# Patient Record
Sex: Female | Born: 2005 | Race: Black or African American | Hispanic: No | Marital: Single | Smoking: Never smoker
Health system: Southern US, Community
[De-identification: ages and names within clinical notes are randomized; demographics above are authoritative.]

## PROBLEM LIST (undated history)

## (undated) DIAGNOSIS — F329 Major depressive disorder, single episode, unspecified: Secondary | ICD-10-CM

## (undated) DIAGNOSIS — F32A Depression, unspecified: Secondary | ICD-10-CM

## (undated) DIAGNOSIS — R51 Headache: Secondary | ICD-10-CM

## (undated) DIAGNOSIS — R519 Headache, unspecified: Secondary | ICD-10-CM

---
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
## 2023-01-21 ENCOUNTER — Telehealth: Payer: Self-pay

## 2023-01-21 NOTE — Telephone Encounter (Signed)
 Called patient no answer and vm not set up. Patient wanted to schedule appointment with Bernell List.

## 2023-02-04 ENCOUNTER — Encounter: Payer: Self-pay | Admitting: Family

## 2023-04-22 ENCOUNTER — Telehealth: Payer: Self-pay

## 2023-04-22 NOTE — Telephone Encounter (Signed)
 Called patient and left message to return call regarding appt with Bernell List

## 2023-11-14 ENCOUNTER — Ambulatory Visit
Admission: EM | Admit: 2023-11-14 | Discharge: 2023-11-14 | Disposition: A | Discharge status: Home / Self Care | Source: Ambulatory Visit | Attending: Family Medicine | Admitting: Family Medicine

## 2023-11-14 DIAGNOSIS — J101 Influenza due to other identified influenza virus with other respiratory manifestations: Secondary | ICD-10-CM

## 2023-11-14 LAB — POC COVID19/FLU A&B COMBO
Covid Antigen, POC: NEGATIVE
Influenza A Antigen, POC: POSITIVE — AB
Influenza B Antigen, POC: NEGATIVE

## 2023-11-14 LAB — POCT RAPID STREP A (OFFICE): Rapid Strep A Screen: NEGATIVE

## 2023-11-14 MED ORDER — ACETAMINOPHEN 325 MG PO TABS
650.0000 mg | ORAL_TABLET | Freq: Once | ORAL | Status: AC
  Administered 2023-11-14: 650 mg via ORAL

## 2023-11-14 MED ORDER — BENZONATATE 200 MG PO CAPS: 200.0000 mg | ORAL_CAPSULE | Freq: Three times a day (TID) | ORAL | 0 refills | Status: AC | PRN

## 2023-11-14 NOTE — Discharge Instructions (Signed)
 You have tested positive for flu A.  This is a viral illness.  You may continue ibuprofen  or Tylenol  over-the-counter as needed for fever management.  You are given Tylenol  while in the clinic.  Start Tessalon  3 times a day as needed for your cough.  Lots of rest and fluids.  Please follow-up with your PCP if your symptoms do not improve.  Please go to the ER if you develop any worsening symptoms.  I hope you feel better soon!

## 2023-11-14 NOTE — ED Triage Notes (Signed)
 Pt present with c/o cough, headache and sore throat x 2 days. Pt states she has felt warm, does ot know if she had a fever.  Home interventions: OTC cough medicine

## 2023-11-14 NOTE — ED Provider Notes (Signed)
 [facility]    CSN: [id number] Arrival date & time: 11/14/23  1029      History   Chief Complaint Chief Complaint  Patient presents with   Cough   Headache   Sore Throat    HPI Teresa Wilkins is a 18 y.o. female  presents for evaluation of URI symptoms for 2 days. Patient reports associated symptoms of cough, congestion, sore throat, headache, body aches, fever. Denies N/V/D, pain, shortness of breath. Patient does not have a hx of asthma. Patient is not an active smoker.   Reports no known sick contacts.  Pt has taken cough medicine OTC for symptoms. Pt has no other concerns at this time.    Cough Associated symptoms: fever, headaches, myalgias and sore throat   Headache Associated symptoms: congestion, cough, fever, myalgias and sore throat   Sore Throat Associated symptoms include headaches.    Past Medical History:  Diagnosis Date   Depression    Headache     Patient Active Problem List   Diagnosis Date Noted   Severe recurrent major depression without psychotic features ([facility]) 11/15/2016   MDD (major depressive disorder), recurrent episode, severe ([facility]) 11/07/2016   Suicide ideation 11/07/2016    History reviewed. No pertinent surgical history.  OB History   No obstetric history on file.      Home Medications    Prior to Admission medications   Medication Sig Start Date End Date Taking? Authorizing Provider  benzonatate  (TESSALON ) 200 MG capsule Take 1 capsule (200 mg total) by mouth 3 (three) times daily as needed. 11/14/23  Yes Samanth Mirkin, Jodi R, NP  escitalopram  (LEXAPRO ) 10 MG tablet Take 1 tablet (10 mg total) daily by mouth. Patient not taking: Reported on 05/02/2018 11/21/16   Debby Fortes, NP    Family History History reviewed. No pertinent family history.  Social History Social History   Tobacco Use   Smoking status: Never    Passive exposure: Yes   Smokeless tobacco: Never  Vaping Use   Vaping status: Never Used   Substance Use Topics   Alcohol use: No   Drug use: No     Allergies   Patient has no known allergies.   Review of Systems Review of Systems  Constitutional:  Positive for fever.  HENT:  Positive for congestion and sore throat.   Respiratory:  Positive for cough.   Musculoskeletal:  Positive for myalgias.  Neurological:  Positive for headaches.     Physical Exam Triage Vital Signs ED Triage Vitals  Encounter Vitals Group     BP 11/14/23 1056 113/77     Girls Systolic BP Percentile --      Girls Diastolic BP Percentile --      Boys Systolic BP Percentile --      Boys Diastolic BP Percentile --      Pulse Rate 11/14/23 1056 (!) 107     Resp 11/14/23 1056 17     Temp 11/14/23 1056 (!) 101.2 F (38.4 C)     Temp Source 11/14/23 1056 Oral     SpO2 11/14/23 1056 95 %     Weight --      Height --      Head Circumference --      Peak Flow --      Pain Score 11/14/23 1055 6     Pain Loc --      Pain Education --      Exclude from Growth Chart --    No  data found.  Updated Vital Signs BP 113/77 (BP Location: Right Arm)   Pulse (!) 107   Temp (!) 101.2 F (38.4 C) (Oral)   Resp 17   LMP  (LMP Unknown) Comment: Pt states she does not remember  SpO2 95%   Visual Acuity Right Eye Distance:   Left Eye Distance:   Bilateral Distance:    Right Eye Near:   Left Eye Near:    Bilateral Near:     Physical Exam Vitals and nursing note reviewed.  Constitutional:      General: She is not in acute distress.    Appearance: She is well-developed. She is not ill-appearing.  HENT:     Head: Normocephalic and atraumatic.     Right Ear: Tympanic membrane and ear canal normal.     Left Ear: Tympanic membrane and ear canal normal.     Nose: Congestion present.     Mouth/Throat:     Mouth: Mucous membranes are moist.     Pharynx: Oropharynx is clear. Uvula midline. Posterior oropharyngeal erythema present.     Tonsils: No tonsillar exudate or tonsillar abscesses.  Eyes:      Conjunctiva/sclera: Conjunctivae normal.     Pupils: Pupils are equal, round, and reactive to light.  Cardiovascular:     Rate and Rhythm: Regular rhythm. Tachycardia present.     Heart sounds: Normal heart sounds.     Comments: Tach in setting of 101.2 degree fever Pulmonary:     Effort: Pulmonary effort is normal.     Breath sounds: Normal breath sounds.  Musculoskeletal:     Cervical back: Normal range of motion and neck supple.  Lymphadenopathy:     Cervical: No cervical adenopathy.  Skin:    General: Skin is warm and dry.  Neurological:     General: No focal deficit present.     Mental Status: She is alert and oriented to person, place, and time.  Psychiatric:        Mood and Affect: Mood normal.        Behavior: Behavior normal.      UC Treatments / Results  Labs (all labs ordered are listed, but only abnormal results are displayed) Labs Reviewed  POC COVID19/FLU A&B COMBO - Abnormal; Notable for the following components:      Result Value   Influenza A Antigen, POC Positive (*)    All other components within normal limits  POCT RAPID STREP A (OFFICE)    EKG   Radiology No results found.  Procedures Procedures (including critical care time)  Medications Ordered in UC Medications  acetaminophen  (TYLENOL ) tablet 650 mg (650 mg Oral Given 11/14/23 1101)    Initial Impression / Assessment and Plan / UC Course  I have reviewed the triage vital signs and the nursing notes.  Pertinent labs & imaging results that were available during my care of the patient were reviewed by me and considered in my medical decision making (see chart for details).     Reviewed exam and symptoms with patient.  No red flags.  Patient given Tylenol  in clinic for fever.  Positive influenza A, discussed viral illness and symptomatic treatment.  Tessalon  as needed for cough.  Encourage continue OTC fever reducing medications, rest fluids and PCP follow-up if symptoms do not improve.   ER precautions reviewed Final Clinical Impressions(s) / UC Diagnoses   Final diagnoses:  Influenza A     Discharge Instructions      You have tested positive  for flu A.  This is a viral illness.  You may continue ibuprofen  or Tylenol  over-the-counter as needed for fever management.  You are given Tylenol  while in the clinic.  Start Tessalon  3 times a day as needed for your cough.  Lots of rest and fluids.  Please follow-up with your PCP if your symptoms do not improve.  Please go to the ER if you develop any worsening symptoms.  I hope you feel better soon!   ED Prescriptions     Medication Sig Dispense Auth. Provider   benzonatate  (TESSALON ) 200 MG capsule Take 1 capsule (200 mg total) by mouth 3 (three) times daily as needed. 20 capsule Yony Roulston, Jodi R, NP      PDMP not reviewed this encounter.   Loreda Myla SAUNDERS, NP 11/14/23 1134

## 2023-12-25 ENCOUNTER — Encounter: Admitting: Family
